# Patient Record
Sex: Female | Born: 1990
Health system: Southern US, Community
[De-identification: ages and names within clinical notes are randomized; demographics above are authoritative.]

## PROBLEM LIST (undated history)

## (undated) DIAGNOSIS — Z789 Other specified health status: Secondary | ICD-10-CM

## (undated) HISTORY — PX: OTHER SURGICAL HISTORY: SHX169

---
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
## 2015-07-02 ENCOUNTER — Inpatient Hospital Stay (HOSPITAL_COMMUNITY): Admit: 2015-07-02 | Payer: Self-pay

## 2016-05-09 DIAGNOSIS — Z4589 Encounter for adjustment and management of other implanted devices: Secondary | ICD-10-CM | POA: Diagnosis not present

## 2016-05-10 DIAGNOSIS — D2239 Melanocytic nevi of other parts of face: Secondary | ICD-10-CM | POA: Diagnosis not present

## 2016-05-10 DIAGNOSIS — D485 Neoplasm of uncertain behavior of skin: Secondary | ICD-10-CM | POA: Diagnosis not present

## 2016-05-10 DIAGNOSIS — D225 Melanocytic nevi of trunk: Secondary | ICD-10-CM | POA: Diagnosis not present

## 2016-05-24 DIAGNOSIS — Z0000 Encounter for general adult medical examination without abnormal findings: Secondary | ICD-10-CM | POA: Diagnosis not present

## 2016-05-30 DIAGNOSIS — D485 Neoplasm of uncertain behavior of skin: Secondary | ICD-10-CM | POA: Diagnosis not present

## 2016-06-13 DIAGNOSIS — D485 Neoplasm of uncertain behavior of skin: Secondary | ICD-10-CM | POA: Diagnosis not present

## 2016-07-07 DIAGNOSIS — M79642 Pain in left hand: Secondary | ICD-10-CM | POA: Diagnosis not present

## 2016-08-29 DIAGNOSIS — N925 Other specified irregular menstruation: Secondary | ICD-10-CM | POA: Diagnosis not present

## 2016-09-25 LAB — OB RESULTS CONSOLE ANTIBODY SCREEN: Antibody Screen: NEGATIVE

## 2016-09-25 LAB — OB RESULTS CONSOLE HIV ANTIBODY (ROUTINE TESTING): HIV: NONREACTIVE

## 2016-09-25 LAB — OB RESULTS CONSOLE ABO/RH: RH Type: NEGATIVE

## 2016-09-25 LAB — OB RESULTS CONSOLE RPR: RPR: NONREACTIVE

## 2016-09-25 LAB — OB RESULTS CONSOLE HEPATITIS B SURFACE ANTIGEN: HEP B S AG: NEGATIVE

## 2016-09-25 LAB — OB RESULTS CONSOLE RUBELLA ANTIBODY, IGM: RUBELLA: IMMUNE

## 2016-12-28 ENCOUNTER — Other Ambulatory Visit (HOSPITAL_COMMUNITY): Payer: Self-pay | Admitting: Obstetrics & Gynecology

## 2016-12-28 DIAGNOSIS — Z3689 Encounter for other specified antenatal screening: Secondary | ICD-10-CM

## 2017-01-03 ENCOUNTER — Encounter (HOSPITAL_COMMUNITY): Payer: Self-pay | Admitting: *Deleted

## 2017-01-05 ENCOUNTER — Ambulatory Visit (HOSPITAL_COMMUNITY)
Admission: RE | Admit: 2017-01-05 | Discharge: 2017-01-05 | Disposition: A | Discharge status: Home / Self Care | Payer: 59 | Source: Ambulatory Visit | Attending: Obstetrics & Gynecology | Admitting: Obstetrics & Gynecology

## 2017-01-05 ENCOUNTER — Other Ambulatory Visit (HOSPITAL_COMMUNITY): Payer: Self-pay | Admitting: Obstetrics & Gynecology

## 2017-01-05 ENCOUNTER — Encounter (HOSPITAL_COMMUNITY): Payer: Self-pay

## 2017-01-05 DIAGNOSIS — E669 Obesity, unspecified: Secondary | ICD-10-CM | POA: Insufficient documentation

## 2017-01-05 DIAGNOSIS — Z6836 Body mass index (BMI) 36.0-36.9, adult: Secondary | ICD-10-CM | POA: Diagnosis not present

## 2017-01-05 DIAGNOSIS — O26842 Uterine size-date discrepancy, second trimester: Secondary | ICD-10-CM | POA: Insufficient documentation

## 2017-01-05 DIAGNOSIS — Z3A26 26 weeks gestation of pregnancy: Secondary | ICD-10-CM | POA: Diagnosis not present

## 2017-01-05 DIAGNOSIS — O99212 Obesity complicating pregnancy, second trimester: Secondary | ICD-10-CM | POA: Diagnosis not present

## 2017-01-05 DIAGNOSIS — Z3689 Encounter for other specified antenatal screening: Secondary | ICD-10-CM | POA: Diagnosis present

## 2017-01-05 HISTORY — DX: Other specified health status: Z78.9

## 2017-01-26 DIAGNOSIS — Z23 Encounter for immunization: Secondary | ICD-10-CM | POA: Diagnosis not present

## 2017-02-27 NOTE — L&D Delivery Note (Signed)
Delivery Note Patient pushed well for 10 minutes.  At 9:57 AM a viable female was delivered via Vaginal, Spontaneous (Presentation: LOA ).  APGAR: 9, 10; weight pending .   Placenta status: Spontaneous , in tact .  Cord: 3V  with the following complications: None .  Cord pH: epidural  Anesthesia:  Epidural Episiotomy: None Lacerations: 2nd degree Suture Repair: 2.0 vicryl rapide Est. Blood Loss (mL): 300  Mom to postpartum.  Baby to Couplet care / Skin to Skin.  Alexa Lopez 04/09/2017, 10:19 AM

## 2017-03-08 LAB — OB RESULTS CONSOLE GBS: STREP GROUP B AG: NEGATIVE

## 2017-04-04 ENCOUNTER — Encounter (HOSPITAL_COMMUNITY): Payer: Self-pay | Admitting: *Deleted

## 2017-04-04 ENCOUNTER — Telehealth (HOSPITAL_COMMUNITY): Payer: Self-pay | Admitting: *Deleted

## 2017-04-04 NOTE — Telephone Encounter (Signed)
Preadmission screen  

## 2017-04-08 ENCOUNTER — Inpatient Hospital Stay (HOSPITAL_COMMUNITY)
Admission: AD | Admit: 2017-04-08 | Discharge: 2017-04-10 | DRG: 807 | Disposition: A | Discharge status: Home / Self Care | Payer: 59 | Source: Ambulatory Visit | Attending: Obstetrics | Admitting: Obstetrics

## 2017-04-08 DIAGNOSIS — O9902 Anemia complicating childbirth: Principal | ICD-10-CM | POA: Diagnosis present

## 2017-04-08 DIAGNOSIS — Z3A40 40 weeks gestation of pregnancy: Secondary | ICD-10-CM

## 2017-04-08 DIAGNOSIS — Z3689 Encounter for other specified antenatal screening: Secondary | ICD-10-CM

## 2017-04-08 DIAGNOSIS — O26893 Other specified pregnancy related conditions, third trimester: Secondary | ICD-10-CM | POA: Diagnosis not present

## 2017-04-08 DIAGNOSIS — D649 Anemia, unspecified: Secondary | ICD-10-CM | POA: Diagnosis present

## 2017-04-08 DIAGNOSIS — N898 Other specified noninflammatory disorders of vagina: Secondary | ICD-10-CM

## 2017-04-08 LAB — URINALYSIS, ROUTINE W REFLEX MICROSCOPIC
BILIRUBIN URINE: NEGATIVE
GLUCOSE, UA: NEGATIVE mg/dL
HGB URINE DIPSTICK: NEGATIVE
Ketones, ur: NEGATIVE mg/dL
NITRITE: NEGATIVE
PROTEIN: NEGATIVE mg/dL
Specific Gravity, Urine: 1.009 (ref 1.005–1.030)
pH: 8 (ref 5.0–8.0)

## 2017-04-08 LAB — POCT FERN TEST: POCT Fern Test: NEGATIVE

## 2017-04-08 LAB — AMNISURE RUPTURE OF MEMBRANE (ROM) NOT AT ARMC: Amnisure ROM: NEGATIVE

## 2017-04-08 NOTE — MAU Note (Signed)
Urine sent to lab 

## 2017-04-08 NOTE — Progress Notes (Signed)
Dr. Rogue Bussing updated on pt status. Pt may be discharge home or walk for one hour

## 2017-04-08 NOTE — Progress Notes (Addendum)
G2P1 @ [redacted] wksga. Here dt SROM clear at 2000 and ctx q3 mins. Denies bleeding. +fM. EMFM applied.  SVE 2/40/-3. Noted curdy vaginal discharge.   Fern test done. Negative  2200: speculum exam done. Pooling of fluid noted. Fern collected from it.   2229: relinquished care over to RN Brigette  2233: provider made aware of the suido-sinusoidal pattern.

## 2017-04-08 NOTE — MAU Provider Note (Signed)
History   [id number]   HPI Alexa Lopez is a 27 y.o. female  G2P1001 @40 .0 wks here with report of LOF around 8pm.  Leaking of fluid has continued. Pt reports q3 min contractions. She denies vaginal bleeding. Last intercourse was not recent. She reports + fetal movement. All other systems negative.    Patient's last menstrual period was 07/02/2016.  OB History  Gravida Para Term Preterm AB Living  2 1 1     1   SAB TAB Ectopic Multiple Live Births          1    # Outcome Date GA Lbr Len/2nd Weight Sex Delivery Anes PTL Lv  2 Current           1 Term 2014 [redacted]w[redacted]d  7 lb 1 oz (3.204 kg) F Vag-Spont Spinal  LIV      Past Medical History:  Diagnosis Date  . Medical history non-contributory     Family History  Problem Relation Age of Onset  . Lung cancer Father   . Heart disease Father   . Hypertension Father   . Atrial fibrillation Father   . Hypertension Maternal Grandmother   . Atrial fibrillation Maternal Grandmother   . Diabetes Maternal Grandmother   . Hypertension Paternal Grandmother   . Atrial fibrillation Paternal Grandmother     Social History   Socioeconomic History  . Marital status: Married    Spouse name: Not on file  . Number of children: Not on file  . Years of education: Not on file  . Highest education level: Not on file  Social Needs  . Financial resource strain: Not on file  . Food insecurity - worry: Not on file  . Food insecurity - inability: Not on file  . Transportation needs - medical: Not on file  . Transportation needs - non-medical: Not on file  Occupational History  . Not on file  Tobacco Use  . Smoking status: Never Smoker  . Smokeless tobacco: Never Used  Substance and Sexual Activity  . Alcohol use: No    Frequency: Never  . Drug use: No  . Sexual activity: Not on file  Other Topics Concern  . Not on file  Social History Narrative  . Not on file    No Known Allergies  No current facility-administered medications on  file prior to encounter.    Current Outpatient Medications on File Prior to Encounter  Medication Sig Dispense Refill  . butalbital-acetaminophen-caffeine (FIORICET WITH CODEINE) 50-325-40-30 MG capsule Take 1 capsule every 4 (four) hours as needed by mouth for headache.    . Prenatal Vit-Fe Fumarate-FA (PRENATAL VITAMIN PO) Take by mouth.       Review of Systems  Gastrointestinal: Positive for abdominal pain (ctx).  Genitourinary: Positive for vaginal discharge.     Physical Exam   Vitals:   04/08/17 2129  BP: 127/81  Pulse: 98  Resp: 18  Temp: 98.3 F (36.8 C)  TempSrc: Oral    Physical Exam  Constitutional: She is oriented to person, place, and time. She appears well-developed and well-nourished. No distress.  HENT:  Head: Normocephalic.  Neck: Normal range of motion.  Respiratory: Effort normal. No respiratory distress.  Genitourinary:  Genitourinary Comments: SSE: +pool cloudy fluid with copious white curdy discharge, fern neg  Musculoskeletal: Normal range of motion.  Neurological: She is alert and oriented to person, place, and time.  Psychiatric: She has a normal mood and affect.  EFM: 150 bpm, mod variability, + accels,  no decels Toco: 1-4  Results for orders placed or performed during the hospital encounter of 04/08/17 (from the past 24 hour(s))  Urinalysis, Routine w reflex microscopic     Status: Abnormal   Collection Time: 04/08/17  9:21 PM  Result Value Ref Range   Color, Urine YELLOW YELLOW   APPearance CLOUDY (A) CLEAR   Specific Gravity, Urine 1.009 1.005 - 1.030   pH 8.0 5.0 - 8.0   Glucose, UA NEGATIVE NEGATIVE mg/dL   Hgb urine dipstick NEGATIVE NEGATIVE   Bilirubin Urine NEGATIVE NEGATIVE   Ketones, ur NEGATIVE NEGATIVE mg/dL   Protein, ur NEGATIVE NEGATIVE mg/dL   Nitrite NEGATIVE NEGATIVE   Leukocytes, UA MODERATE (A) NEGATIVE   RBC / HPF 0-5 0 - 5 RBC/hpf   WBC, UA 6-30 0 - 5 WBC/hpf   Bacteria, UA RARE (A) NONE SEEN   Squamous  Epithelial / LPF 0-5 (A) NONE SEEN   WBC Clumps PRESENT    Mucus PRESENT   Fern Test     Status: None   Collection Time: 04/08/17 10:00 PM  Result Value Ref Range   POCT Fern Test Negative = intact amniotic membranes   Amnisure rupture of membrane (rom)not at [facility]     Status: None   Collection Time: 04/08/17 10:22 PM  Result Value Ref Range   Amnisure ROM NEGATIVE    MAU Course  Procedures  MDM Labs ordered and reviewed. No evidence of ROM. Management per Dr. Rogue Bussing.  Assessment and Plan  [redacted] weeks gestation Reactive NST Vaginal discharge in pregnancy Mngt per Dr. Estill Batten, [place], [place] 04/08/2017 10:05 PM

## 2017-04-09 ENCOUNTER — Inpatient Hospital Stay (HOSPITAL_COMMUNITY): Payer: 59 | Admitting: Anesthesiology

## 2017-04-09 ENCOUNTER — Encounter (HOSPITAL_COMMUNITY): Payer: Self-pay

## 2017-04-09 DIAGNOSIS — D649 Anemia, unspecified: Secondary | ICD-10-CM | POA: Diagnosis not present

## 2017-04-09 DIAGNOSIS — Z3483 Encounter for supervision of other normal pregnancy, third trimester: Secondary | ICD-10-CM | POA: Diagnosis not present

## 2017-04-09 DIAGNOSIS — O9902 Anemia complicating childbirth: Secondary | ICD-10-CM | POA: Diagnosis not present

## 2017-04-09 DIAGNOSIS — Z3A40 40 weeks gestation of pregnancy: Secondary | ICD-10-CM | POA: Diagnosis not present

## 2017-04-09 LAB — CBC
HCT: 34.5 % — ABNORMAL LOW (ref 36.0–46.0)
Hemoglobin: 11.3 g/dL — ABNORMAL LOW (ref 12.0–15.0)
MCH: 28.2 pg (ref 26.0–34.0)
MCHC: 32.8 g/dL (ref 30.0–36.0)
MCV: 86 fL (ref 78.0–100.0)
PLATELETS: 242 10*3/uL (ref 150–400)
RBC: 4.01 MIL/uL (ref 3.87–5.11)
RDW: 14 % (ref 11.5–15.5)
WBC: 11.2 10*3/uL — ABNORMAL HIGH (ref 4.0–10.5)

## 2017-04-09 LAB — RPR: RPR Ser Ql: NONREACTIVE

## 2017-04-09 MED ORDER — IBUPROFEN 600 MG PO TABS
600.0000 mg | ORAL_TABLET | Freq: Four times a day (QID) | ORAL | Status: DC
  Administered 2017-04-09 – 2017-04-10 (×5): 600 mg via ORAL
  Filled 2017-04-09 (×5): qty 1

## 2017-04-09 MED ORDER — PRENATAL MULTIVITAMIN CH
1.0000 | ORAL_TABLET | Freq: Every day | ORAL | Status: DC
  Administered 2017-04-09: 1 via ORAL
  Filled 2017-04-09: qty 1

## 2017-04-09 MED ORDER — OXYCODONE-ACETAMINOPHEN 5-325 MG PO TABS: 2.0000 | ORAL_TABLET | ORAL | Status: DC | PRN

## 2017-04-09 MED ORDER — BENZOCAINE-MENTHOL 20-0.5 % EX AERO
1.0000 "application " | INHALATION_SPRAY | CUTANEOUS | Status: DC | PRN
  Administered 2017-04-09: 1 via TOPICAL
  Filled 2017-04-09: qty 56

## 2017-04-09 MED ORDER — LACTATED RINGERS IV SOLN
500.0000 mL | Freq: Once | INTRAVENOUS | Status: AC
  Administered 2017-04-09: 500 mL via INTRAVENOUS

## 2017-04-09 MED ORDER — PHENYLEPHRINE 40 MCG/ML (10ML) SYRINGE FOR IV PUSH (FOR BLOOD PRESSURE SUPPORT)
80.0000 ug | PREFILLED_SYRINGE | INTRAVENOUS | Status: DC | PRN
  Filled 2017-04-09: qty 10

## 2017-04-09 MED ORDER — ACETAMINOPHEN 325 MG PO TABS: 650.0000 mg | ORAL_TABLET | ORAL | Status: DC | PRN

## 2017-04-09 MED ORDER — SIMETHICONE 80 MG PO CHEW: 80.0000 mg | CHEWABLE_TABLET | ORAL | Status: DC | PRN

## 2017-04-09 MED ORDER — COCONUT OIL OIL: 1.0000 "application " | TOPICAL_OIL | Status: DC | PRN

## 2017-04-09 MED ORDER — OXYCODONE HCL 5 MG PO TABS: 10.0000 mg | ORAL_TABLET | ORAL | Status: DC | PRN

## 2017-04-09 MED ORDER — LACTATED RINGERS IV SOLN
INTRAVENOUS | Status: DC
  Administered 2017-04-09 (×2): via INTRAVENOUS

## 2017-04-09 MED ORDER — SENNOSIDES-DOCUSATE SODIUM 8.6-50 MG PO TABS
2.0000 | ORAL_TABLET | ORAL | Status: DC
  Administered 2017-04-09: 2 via ORAL
  Filled 2017-04-09: qty 2

## 2017-04-09 MED ORDER — OXYTOCIN 40 UNITS IN LACTATED RINGERS INFUSION - SIMPLE MED
2.5000 [IU]/h | INTRAVENOUS | Status: DC
  Filled 2017-04-09: qty 1000

## 2017-04-09 MED ORDER — ONDANSETRON HCL 4 MG/2ML IJ SOLN: 4.0000 mg | Freq: Four times a day (QID) | INTRAMUSCULAR | Status: DC | PRN

## 2017-04-09 MED ORDER — OXYCODONE-ACETAMINOPHEN 5-325 MG PO TABS: 1.0000 | ORAL_TABLET | ORAL | Status: DC | PRN

## 2017-04-09 MED ORDER — ONDANSETRON HCL 4 MG/2ML IJ SOLN: 4.0000 mg | INTRAMUSCULAR | Status: DC | PRN

## 2017-04-09 MED ORDER — TETANUS-DIPHTH-ACELL PERTUSSIS 5-2.5-18.5 LF-MCG/0.5 IM SUSP: 0.5000 mL | Freq: Once | INTRAMUSCULAR | Status: DC

## 2017-04-09 MED ORDER — DIBUCAINE 1 % RE OINT: 1.0000 "application " | TOPICAL_OINTMENT | RECTAL | Status: DC | PRN

## 2017-04-09 MED ORDER — ONDANSETRON HCL 4 MG PO TABS: 4.0000 mg | ORAL_TABLET | ORAL | Status: DC | PRN

## 2017-04-09 MED ORDER — FENTANYL 2.5 MCG/ML BUPIVACAINE 1/10 % EPIDURAL INFUSION (WH - ANES)
14.0000 mL/h | INTRAMUSCULAR | Status: DC | PRN
  Administered 2017-04-09 (×2): 14 mL/h via EPIDURAL
  Filled 2017-04-09 (×2): qty 100

## 2017-04-09 MED ORDER — PHENYLEPHRINE 40 MCG/ML (10ML) SYRINGE FOR IV PUSH (FOR BLOOD PRESSURE SUPPORT): 80.0000 ug | PREFILLED_SYRINGE | INTRAVENOUS | Status: DC | PRN

## 2017-04-09 MED ORDER — EPHEDRINE 5 MG/ML INJ: 10.0000 mg | INTRAVENOUS | Status: DC | PRN

## 2017-04-09 MED ORDER — WITCH HAZEL-GLYCERIN EX PADS: 1.0000 "application " | MEDICATED_PAD | CUTANEOUS | Status: DC | PRN

## 2017-04-09 MED ORDER — LIDOCAINE HCL (PF) 1 % IJ SOLN
INTRAMUSCULAR | Status: DC | PRN
  Administered 2017-04-09 (×2): 5 mL via EPIDURAL

## 2017-04-09 MED ORDER — DIPHENHYDRAMINE HCL 25 MG PO CAPS: 25.0000 mg | ORAL_CAPSULE | Freq: Four times a day (QID) | ORAL | Status: DC | PRN

## 2017-04-09 MED ORDER — LACTATED RINGERS IV SOLN: 500.0000 mL | INTRAVENOUS | Status: DC | PRN

## 2017-04-09 MED ORDER — ACETAMINOPHEN 325 MG PO TABS
650.0000 mg | ORAL_TABLET | ORAL | Status: DC | PRN
Start: 2017-04-09 — End: 2017-04-09

## 2017-04-09 MED ORDER — LIDOCAINE HCL (PF) 1 % IJ SOLN: 30.0000 mL | INTRAMUSCULAR | Status: DC | PRN

## 2017-04-09 MED ORDER — OXYCODONE HCL 5 MG PO TABS: 5.0000 mg | ORAL_TABLET | ORAL | Status: DC | PRN

## 2017-04-09 MED ORDER — SOD CITRATE-CITRIC ACID 500-334 MG/5ML PO SOLN: 30.0000 mL | ORAL | Status: DC | PRN

## 2017-04-09 MED ORDER — FLEET ENEMA 7-19 GM/118ML RE ENEM: 1.0000 | ENEMA | RECTAL | Status: DC | PRN

## 2017-04-09 MED ORDER — DIPHENHYDRAMINE HCL 50 MG/ML IJ SOLN: 12.5000 mg | INTRAMUSCULAR | Status: DC | PRN

## 2017-04-09 MED ORDER — BUTORPHANOL TARTRATE 1 MG/ML IJ SOLN
1.0000 mg | INTRAMUSCULAR | Status: DC | PRN
  Administered 2017-04-09: 1 mg via INTRAVENOUS
  Filled 2017-04-09: qty 1

## 2017-04-09 MED ORDER — OXYTOCIN BOLUS FROM INFUSION
500.0000 mL | Freq: Once | INTRAVENOUS | Status: AC
  Administered 2017-04-09: 500 mL via INTRAVENOUS

## 2017-04-09 NOTE — Progress Notes (Signed)
Comfortable w epidural.  Starting to feel some pressure  BP 127/77   Pulse 95   Temp 98.6 F (37 C) (Oral)   Resp 16   Ht 5\' 3"  (1.6 m)   Wt 95.3 kg (210 lb)   LMP 07/02/2016   SpO2 99%   BMI 37.20 kg/m   Abd: gravid, soft, EFW 7# Toco: q2 min EFM: 160, mod var, + accels, no decels SVE: 8/90/-1  A&P: g2p1 @ [redacted]w[redacted]d w labor Cont expectant management. If no cervical change in 2 hours will start pitocin FSR/vtx/gbs neg

## 2017-04-09 NOTE — Anesthesia Pain Management Evaluation Note (Signed)
  CRNA Pain Management Visit Note  Patient: Alexa Lopez, 27 y.o., female  "Hello I am a member of the anesthesia team at [facility] We have an anesthesia team available at all times to provide care throughout the hospital, including epidural management and anesthesia for C-section. I don't know your plan for the delivery whether it a natural birth, water birth, IV sedation, nitrous supplementation, doula or epidural, but we want to meet your pain goals."   1.Was your pain managed to your expectations on prior hospitalizations?   Yes   2.What is your expectation for pain management during this hospitalization?     Epidural  3.How can we help you reach that goal? epidural  Record the patient's initial score and the patient's pain goal.   Pain: 0  Pain Goal: 4 [facility] wants you to be able to say your pain was always managed very well.  Jabier Mutton 04/09/2017

## 2017-04-09 NOTE — Anesthesia Procedure Notes (Signed)
Epidural Patient location during procedure: OB Start time: 04/09/2017 1:45 AM  Staffing Anesthesiologist: Murvin Natal, MD Performed: anesthesiologist   Preanesthetic Checklist Completed: patient identified, site marked, pre-op evaluation, timeout performed, IV checked, risks and benefits discussed and monitors and equipment checked  Epidural Patient position: sitting Prep: DuraPrep Patient monitoring: heart rate, cardiac monitor, continuous pulse ox and blood pressure Approach: midline Location: L4-L5 Injection technique: LOR air  Needle:  Needle type: Tuohy  Needle gauge: 17 G Needle length: 9 cm Needle insertion depth: 7 cm Catheter type: closed end flexible Catheter size: 19 Gauge Catheter at skin depth: 12 cm Test dose: negative and Other  Assessment Events: blood not aspirated, injection not painful, no injection resistance and negative IV test  Additional Notes Informed consent obtained prior to proceeding including risk of failure, 1% risk of PDPH, risk of minor discomfort and bruising. Discussed alternatives to epidural analgesia and patient desires to proceed.  Timeout performed pre-procedure verifying patient name, procedure, and platelet count.  Patient tolerated procedure well. Reason for block:procedure for pain

## 2017-04-09 NOTE — Lactation Note (Signed)
This note was copied from a baby's chart. Lactation Consultation Note  Patient Name: Alexa Lopez EHOZY'Y Date: 04/09/2017 Reason for consult: Initial assessment;Term  Visited with P2 Mom with 23 hr old term baby.  Mom has latched baby 4 times and denies any difficulty.  With her 1st baby (5 yrs ago), baby stopped breastfeeding for "no reason".  She took baby to the Pediatrician, and started supplementing baby with formula.  She would like to breastfeed this baby longer if possible.  Asked about her milk supply, and she reports knowing when her milk volume came in, baby gained weight well, no nipple trauma etc.    This baby was STS and cueing, and offered to assist with latching.  Mom has short shafted large diameter nipples.  Explained how to sandwich up breast to facilitate a deeper latch to breast.  Baby latched with out much trouble.  Regular swallowing identified for Mom.  Taught her how to use alternate breast compression to increase milk transfer.   Encouraged continued STS, and feeding baby often on cue, asking for help with latch as needed. Hand expression demonstrated and colostrum easy to express.  Encouraged Mom to hand express/use breast massage to stimulate milk supply.  Talked about spoon feeding if baby isn't latching.  Lactation brochure given to Mom.  Explained about IP and OP lactation support. Mom to call prn.   Maternal Data Formula Feeding for Exclusion: No Has patient been taught Hand Expression?: Yes Does the patient have breastfeeding experience prior to this delivery?: Yes  Feeding Feeding Type: Breast Fed  LATCH Score Latch: Grasps breast easily, tongue down, lips flanged, rhythmical sucking.  Audible Swallowing: Spontaneous and intermittent  Type of Nipple: Everted at rest and after stimulation(Large diameter)  Comfort (Breast/Nipple): Soft / non-tender  Hold (Positioning): Assistance needed to correctly position infant at breast and maintain  latch.  LATCH Score: 9  Interventions Interventions: Breast feeding basics reviewed;Assisted with latch;Skin to skin;Breast massage;Hand express;Breast compression;Adjust position;Support pillows;Position options;Expressed milk  Lactation Tools Discussed/Used WIC Program: No   Consult Status Consult Status: Follow-up Date: 04/10/17 Follow-up type: In-patient    [name] 04/09/2017, 6:14 PM

## 2017-04-09 NOTE — H&P (Addendum)
27 y.o. [redacted]w[redacted]d  G2P1001 comes in c/o contractions.  Otherwise has good fetal movement and no bleeding.  Past Medical History:  Diagnosis Date  . Medical history non-contributory     Past Surgical History:  Procedure Laterality Date  . thumb surgery      OB History  Gravida Para Term Preterm AB Living  2 1 1     1   SAB TAB Ectopic Multiple Live Births          1    # Outcome Date GA Lbr Len/2nd Weight Sex Delivery Anes PTL Lv  2 Current           1 Term 2014 [redacted]w[redacted]d  3.204 kg (7 lb 1 oz) F Vag-Spont Spinal  LIV      Social History   Socioeconomic History  . Marital status: Married    Spouse name: Not on file  . Number of children: Not on file  . Years of education: Not on file  . Highest education level: Not on file  Social Needs  . Financial resource strain: Not on file  . Food insecurity - worry: Not on file  . Food insecurity - inability: Not on file  . Transportation needs - medical: Not on file  . Transportation needs - non-medical: Not on file  Occupational History  . Not on file  Tobacco Use  . Smoking status: Never Smoker  . Smokeless tobacco: Never Used  Substance and Sexual Activity  . Alcohol use: No    Frequency: Never  . Drug use: No  . Sexual activity: Not on file  Other Topics Concern  . Not on file  Social History Narrative  . Not on file   Patient has no known allergies.    Prenatal Transfer Tool  Maternal Diabetes: No Genetic Screening: Normal Maternal Ultrasounds/Referrals: Abnormal:  Findings:   Other: lagging measurement of fetal head Fetal Ultrasounds or other Referrals:  Referred to Materal Fetal Medicine - subsequent US showing appropriate symmetric fetal growth Maternal Substance Abuse:  No Significant Maternal Medications:  None Significant Maternal Lab Results: Lab values include: Group B Strep negative  Other PNC: uncomplicated.    Vitals:   04/09/17 0221 04/09/17 0226 04/09/17 0230 04/09/17 0300  BP: 115/68 98/73 108/64  106/65  Pulse: 94 89 100 89  Resp:      Temp:      TempSrc:      SpO2:      Weight:      Height:       PE per MAU CNM Lungs/Cor:  NAD Abdomen:  soft, gravid Ex:  no cords, erythema SVE:  4/80/-2 at admission FHTs:  130, good STV, NST R Toco:  q2-3   A/P   Admit to L&D with labor at term  GBS Neg  Epidural already placed  Continue to monitor  [place], [facility]

## 2017-04-09 NOTE — Anesthesia Postprocedure Evaluation (Signed)
Anesthesia Post Note  Patient: Alexa Lopez  Procedure(s) Performed: AN AD HOC LABOR EPIDURAL     Patient location during evaluation: Mother Baby Anesthesia Type: Epidural Level of consciousness: awake and alert Pain management: pain level controlled Vital Signs Assessment: post-procedure vital signs reviewed and stable Respiratory status: spontaneous breathing, nonlabored ventilation and respiratory function stable Cardiovascular status: stable Postop Assessment: no headache, no backache, epidural receding, adequate PO intake, no apparent nausea or vomiting and patient able to bend at knees Anesthetic complications: no    Last Vitals:  Vitals:   04/09/17 1141 04/09/17 1230  BP: 127/72 116/65  Pulse: 95 95  Resp: 16 18  Temp: 37.1 C 36.9 C  SpO2:  97%    Last Pain:  Vitals:   04/09/17 1230  TempSrc: Oral  PainSc: 1    Pain Goal:                 Terin Cragle Hristova

## 2017-04-09 NOTE — Anesthesia Preprocedure Evaluation (Signed)
Anesthesia Evaluation  Patient identified by MRN, date of birth, ID band Patient awake    Reviewed: Allergy & Precautions, H&P , NPO status , Patient's Chart, lab work & pertinent test results  History of Anesthesia Complications Negative for: history of anesthetic complications  Airway Mallampati: II  TM Distance: >3 FB Neck ROM: full    Dental no notable dental hx. (+) Teeth Intact   Pulmonary neg pulmonary ROS,    Pulmonary exam normal breath sounds clear to auscultation       Cardiovascular negative cardio ROS Normal cardiovascular exam Rhythm:regular Rate:Normal     Neuro/Psych negative neurological ROS  negative psych ROS   GI/Hepatic negative GI ROS, Neg liver ROS,   Endo/Other  negative endocrine ROS  Renal/GU negative Renal ROS  negative genitourinary   Musculoskeletal   Abdominal (+) + obese,   Peds  Hematology  (+) anemia ,   Anesthesia Other Findings   Reproductive/Obstetrics (+) Pregnancy                             Anesthesia Physical Anesthesia Plan  ASA: II  Anesthesia Plan: Epidural   Post-op Pain Management:    Induction:   PONV Risk Score and Plan:   Airway Management Planned:   Additional Equipment:   Intra-op Plan:   Post-operative Plan:   Informed Consent: I have reviewed the patients History and Physical, chart, labs and discussed the procedure including the risks, benefits and alternatives for the proposed anesthesia with the patient or authorized representative who has indicated his/her understanding and acceptance.     Plan Discussed with:   Anesthesia Plan Comments:         Anesthesia Quick Evaluation

## 2017-04-10 ENCOUNTER — Inpatient Hospital Stay (HOSPITAL_COMMUNITY): Admission: RE | Admit: 2017-04-10 | Payer: 59 | Source: Ambulatory Visit

## 2017-04-10 ENCOUNTER — Other Ambulatory Visit: Payer: Self-pay

## 2017-04-10 LAB — CBC
HCT: 29 % — ABNORMAL LOW (ref 36.0–46.0)
Hemoglobin: 9.7 g/dL — ABNORMAL LOW (ref 12.0–15.0)
MCH: 28.6 pg (ref 26.0–34.0)
MCHC: 33.4 g/dL (ref 30.0–36.0)
MCV: 85.5 fL (ref 78.0–100.0)
PLATELETS: 181 10*3/uL (ref 150–400)
RBC: 3.39 MIL/uL — AB (ref 3.87–5.11)
RDW: 14.2 % (ref 11.5–15.5)
WBC: 10.2 10*3/uL (ref 4.0–10.5)

## 2017-04-10 LAB — BIRTH TISSUE RECOVERY COLLECTION (PLACENTA DONATION)

## 2017-04-10 MED ORDER — OXYCODONE-ACETAMINOPHEN 5-325 MG PO TABS: 1.0000 | ORAL_TABLET | Freq: Four times a day (QID) | ORAL | 0 refills | Status: AC | PRN

## 2017-04-10 MED ORDER — IBUPROFEN 600 MG PO TABS: 600.0000 mg | ORAL_TABLET | Freq: Four times a day (QID) | ORAL | 0 refills | Status: AC | PRN

## 2017-04-10 MED ORDER — RHO D IMMUNE GLOBULIN 1500 UNIT/2ML IJ SOSY
300.0000 ug | PREFILLED_SYRINGE | Freq: Once | INTRAMUSCULAR | Status: AC
  Administered 2017-04-10: 300 ug via INTRAVENOUS
  Filled 2017-04-10: qty 2

## 2017-04-10 NOTE — Discharge Summary (Signed)
Obstetric Discharge Summary Reason for Admission: onset of labor Prenatal Procedures: ultrasound Intrapartum Procedures: spontaneous vaginal delivery Postpartum Procedures: none Complications-Operative and Postpartum: 2nd degree perineal laceration Hemoglobin  Date Value Ref Range Status  04/10/2017 9.7 (L) 12.0 - 15.0 g/dL Final   HCT  Date Value Ref Range Status  04/10/2017 29.0 (L) 36.0 - 46.0 % Final    Physical Exam:  General: alert, cooperative and appears stated age 6: appropriate Uterine Fundus: firm DVT Evaluation: No evidence of DVT seen on physical exam.  Discharge Diagnoses: Term Pregnancy-delivered  Discharge Information: Date: 04/10/2017 Activity: pelvic rest Diet: routine Medications: Ibuprofen and Percocet Condition: improved Instructions: refer to practice specific booklet Discharge to: home Follow-up Information    Jerelyn Charles, MD Follow up in 4 week(s).   Specialty:  Obstetrics Why:  For a post-partum eval Contact information: [place] [address] [phone]           Newborn Data: Live born female  Birth Weight: 7 lb 2.3 oz (3240 g) APGAR: 77, 10  Newborn Delivery   Birth date/time:  04/09/2017 09:57:00 Delivery type:  Vaginal, Spontaneous     Home with mother.  Vanessa Kick 04/10/2017, 9:23 AM

## 2017-04-10 NOTE — Lactation Note (Signed)
This note was copied from a baby's chart. Lactation Consultation Note  Patient Name: Alexa Lopez OOILN'Z Date: 04/10/2017 Reason for consult: Term;Follow-up assessment  12 hours old female who is being exclusively BF by her mother, she's a P2 and feels very confident and experienced with BF. Discussed discharged instructions with mom and what signs to watch for to make sure baby is getting enough breastmilk. Also, reviewed engorgement prevention and treatment, mom is aware of [place] services and will contact if needed.    Interventions Interventions: Breast feeding basics reviewed  Lactation Tools Discussed/Used     Consult Status Consult Status: Complete    Cyndee Giammarco S Halli Equihua 04/10/2017, 11:12 AM

## 2017-04-10 NOTE — Progress Notes (Signed)
Post Partum Day 1 Subjective: no complaints, up ad lib, voiding and tolerating PO  Objective: Blood pressure 115/63, pulse 71, temperature (!) 97.5 F (36.4 C), temperature source Oral, resp. rate 17, height 5\' 3"  (1.6 m), weight 95.3 kg (210 lb), last menstrual period 07/02/2016, SpO2 99 %, unknown if currently breastfeeding.  Physical Exam:  General: alert, cooperative and appears stated age Lochia: appropriate Uterine Fundus: firm   Recent Labs    04/09/17 0040 04/10/17 0535  HGB 11.3* 9.7*  HCT 34.5* 29.0*    Assessment/Plan: Discharge home  Breastfeeding Pt desires early D?C home. Will D/C home, if baby not discharged will cancel mother's discharge   LOS: 1 day   Vanessa Kick 04/10/2017, 9:25 AM

## 2017-04-11 LAB — RH IG WORKUP (INCLUDES ABO/RH)
ABO/RH(D): A NEG
FETAL SCREEN: NEGATIVE
Gestational Age(Wks): 40.1
Unit division: 0

## 2017-04-13 LAB — TYPE AND SCREEN
ABO/RH(D): A NEG
Antibody Screen: POSITIVE
UNIT DIVISION: 0
Unit division: 0

## 2017-04-13 LAB — BPAM RBC
Blood Product Expiration Date: 201903022359
Blood Product Expiration Date: 201903022359
UNIT TYPE AND RH: 600
Unit Type and Rh: 600

## 2017-06-01 DIAGNOSIS — Z3202 Encounter for pregnancy test, result negative: Secondary | ICD-10-CM | POA: Diagnosis not present

## 2017-11-29 DIAGNOSIS — R5383 Other fatigue: Secondary | ICD-10-CM | POA: Diagnosis not present

## 2017-11-29 DIAGNOSIS — Z6834 Body mass index (BMI) 34.0-34.9, adult: Secondary | ICD-10-CM | POA: Diagnosis not present

## 2017-11-29 DIAGNOSIS — R109 Unspecified abdominal pain: Secondary | ICD-10-CM | POA: Diagnosis not present

## 2017-12-06 DIAGNOSIS — D510 Vitamin B12 deficiency anemia due to intrinsic factor deficiency: Secondary | ICD-10-CM | POA: Diagnosis not present

## 2017-12-14 DIAGNOSIS — D510 Vitamin B12 deficiency anemia due to intrinsic factor deficiency: Secondary | ICD-10-CM | POA: Diagnosis not present

## 2017-12-21 DIAGNOSIS — D510 Vitamin B12 deficiency anemia due to intrinsic factor deficiency: Secondary | ICD-10-CM | POA: Diagnosis not present

## 2018-01-01 DIAGNOSIS — D510 Vitamin B12 deficiency anemia due to intrinsic factor deficiency: Secondary | ICD-10-CM | POA: Diagnosis not present

## 2018-02-05 DIAGNOSIS — D510 Vitamin B12 deficiency anemia due to intrinsic factor deficiency: Secondary | ICD-10-CM | POA: Diagnosis not present

## 2018-03-06 DIAGNOSIS — D225 Melanocytic nevi of trunk: Secondary | ICD-10-CM | POA: Diagnosis not present

## 2018-03-06 DIAGNOSIS — D485 Neoplasm of uncertain behavior of skin: Secondary | ICD-10-CM | POA: Diagnosis not present

## 2018-03-06 DIAGNOSIS — D224 Melanocytic nevi of scalp and neck: Secondary | ICD-10-CM | POA: Diagnosis not present

## 2018-03-21 DIAGNOSIS — D485 Neoplasm of uncertain behavior of skin: Secondary | ICD-10-CM | POA: Diagnosis not present

## 2018-03-22 DIAGNOSIS — D510 Vitamin B12 deficiency anemia due to intrinsic factor deficiency: Secondary | ICD-10-CM | POA: Diagnosis not present

## 2018-04-05 DIAGNOSIS — E669 Obesity, unspecified: Secondary | ICD-10-CM | POA: Diagnosis not present

## 2018-04-05 DIAGNOSIS — Z0000 Encounter for general adult medical examination without abnormal findings: Secondary | ICD-10-CM | POA: Diagnosis not present

## 2018-04-05 DIAGNOSIS — Z6832 Body mass index (BMI) 32.0-32.9, adult: Secondary | ICD-10-CM | POA: Diagnosis not present

## 2018-04-29 DIAGNOSIS — E538 Deficiency of other specified B group vitamins: Secondary | ICD-10-CM | POA: Diagnosis not present

## 2018-06-04 DIAGNOSIS — D519 Vitamin B12 deficiency anemia, unspecified: Secondary | ICD-10-CM | POA: Diagnosis not present

## 2018-07-01 DIAGNOSIS — E538 Deficiency of other specified B group vitamins: Secondary | ICD-10-CM | POA: Diagnosis not present

## 2018-11-18 IMAGING — US US MFM OB DETAIL+14 WK
1 series · 13 of 28 positions shown · non-contrast
Comparison: none

[Series 1: us mfm ob detail+14 wk · 13 of 104 slices shown]
[im 4/104]
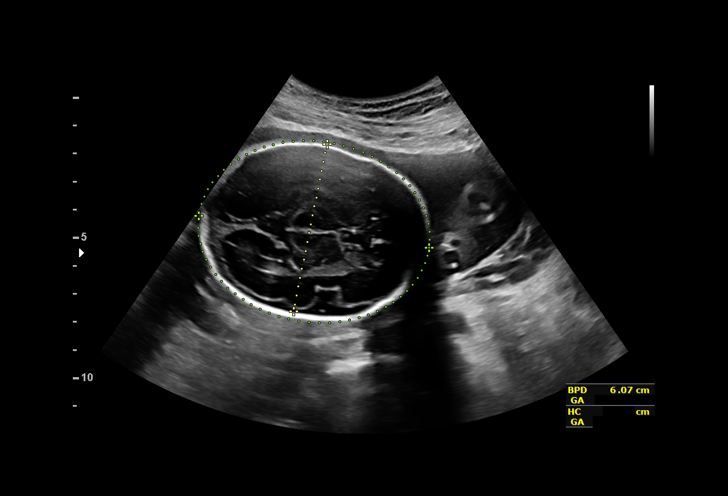
[im 12/104]
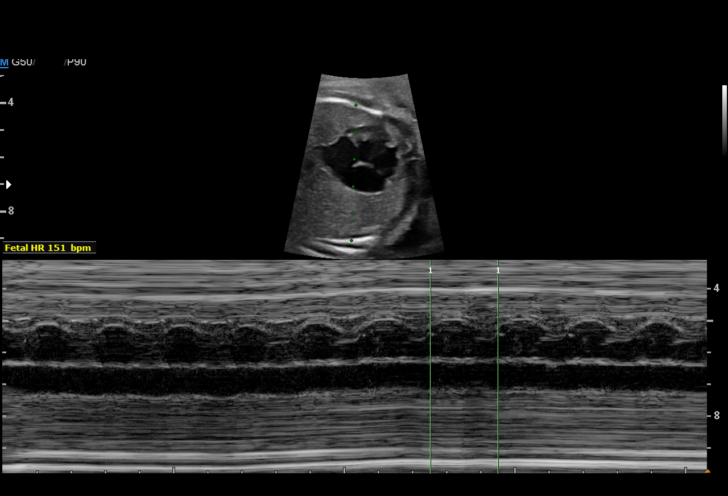
[im 20/104]
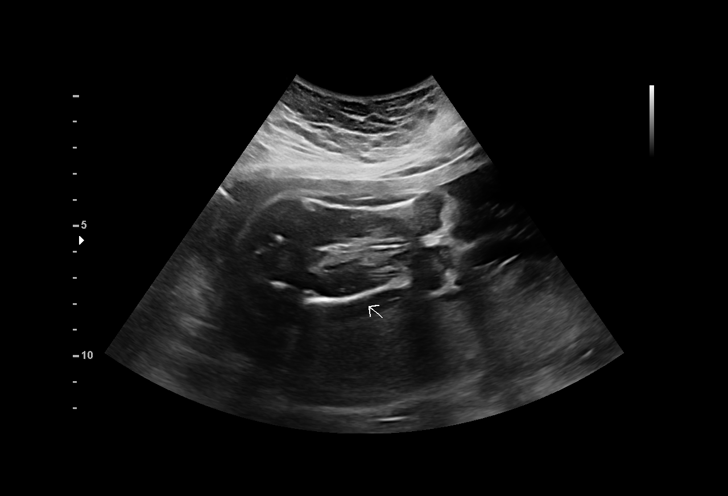
[im 27/104]
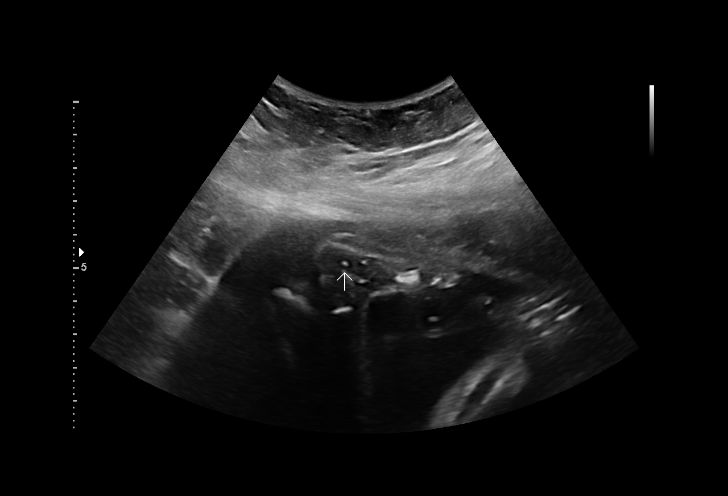
[im 35/104]
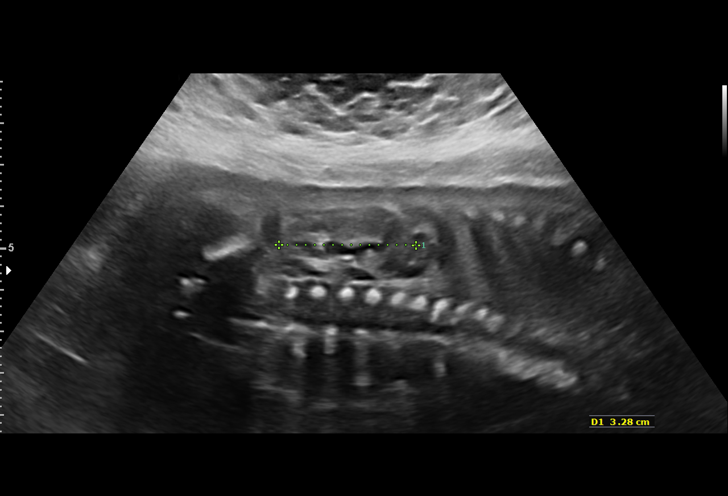
[im 42/104]
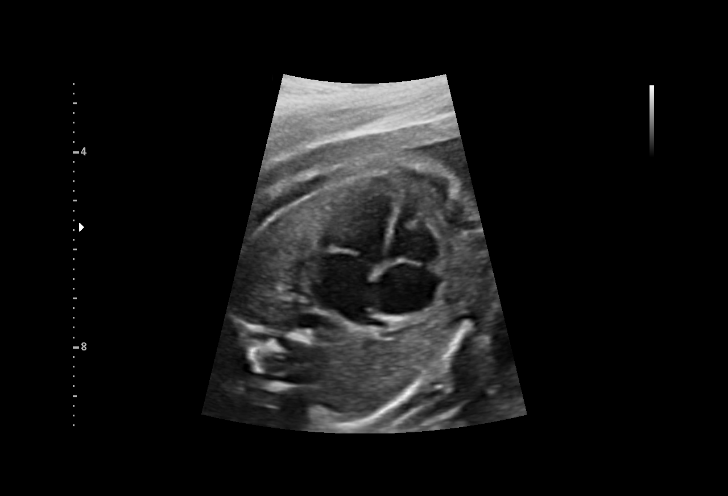
[im 54/104]
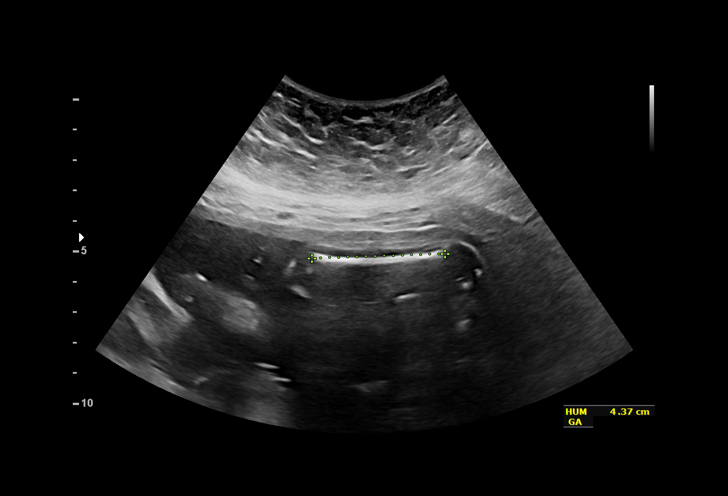
[im 62/104]
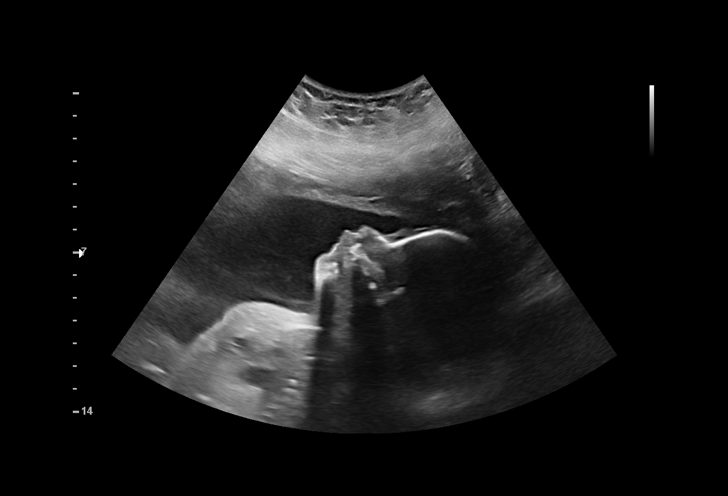
[im 69/104]
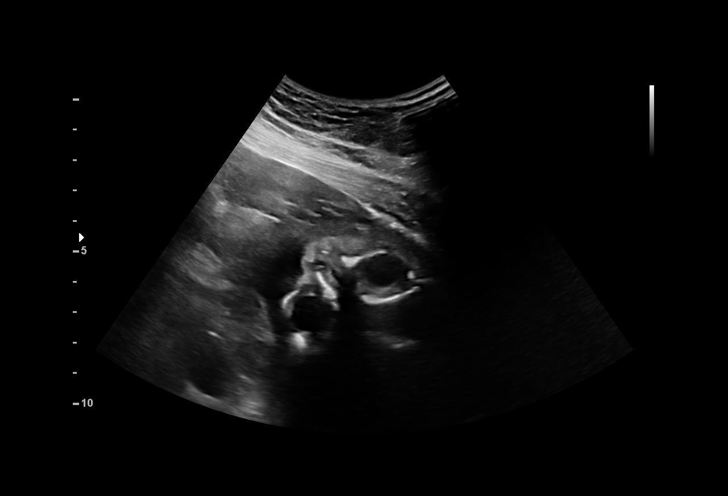
[im 77/104]
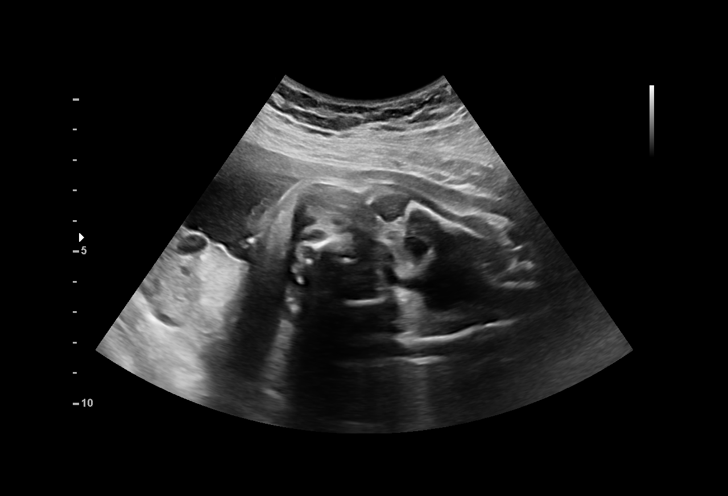
[im 84/104]
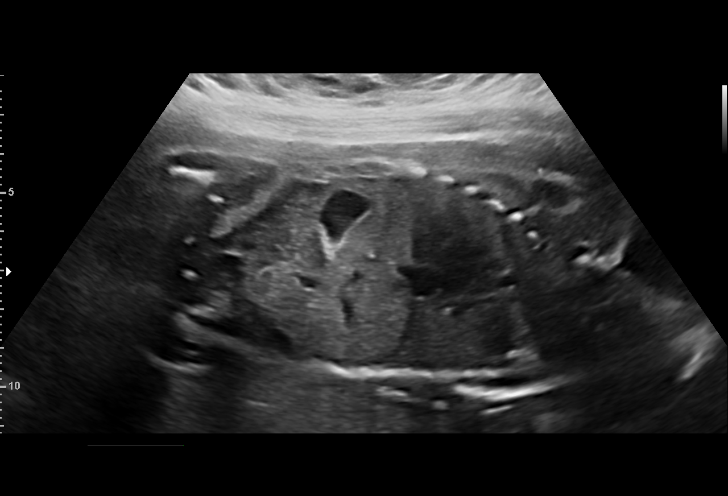
[im 92/104]
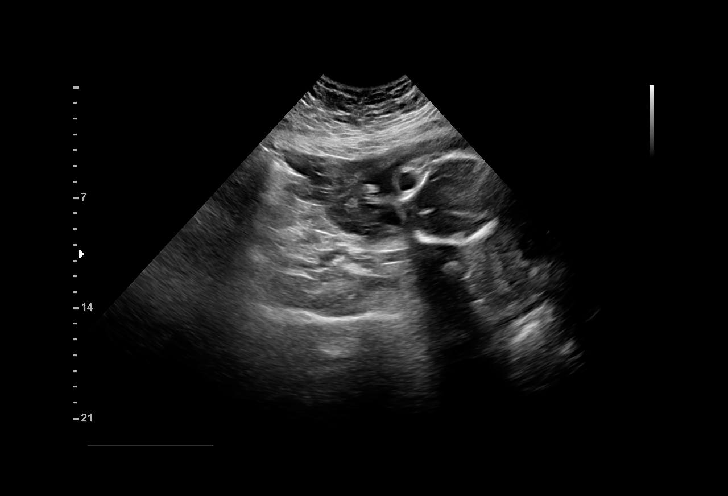
[im 100/104]
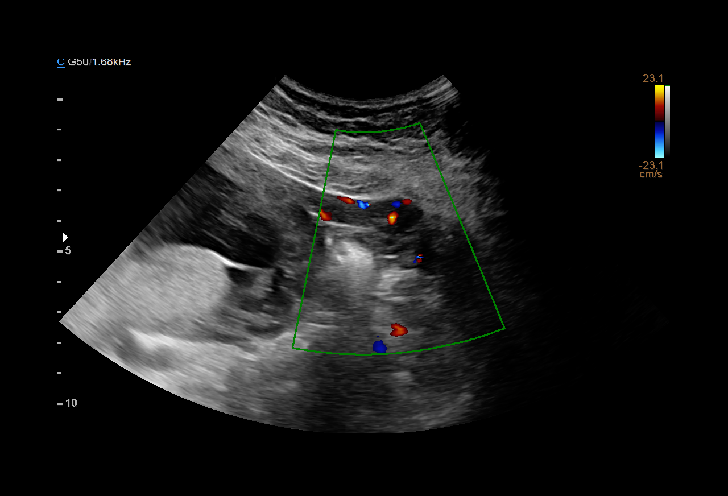

[13 of 28 positions shown; findings below may reference images not displayed]

1  JERRY JOVEL               [id number]      [id number]     [id number]
Indications

26 weeks gestation of pregnancy
Obesity complicating pregnancy, second
trimester (prepreg BMI 33.1)
Size-Date Discrepancy
OB History

Blood Type:            Height:  5'3"   Weight (lb):  205       BMI:
Gravidity:    2         Term:   1
Living:       1
Fetal Evaluation

Num Of Fetuses:     1
Fetal Heart         151
Rate(bpm):
Cardiac Activity:   Observed
Presentation:       Cephalic
Placenta:           Posterior, above cervical os
P. Cord Insertion:  Not well visualized

Amniotic Fluid
AFI FV:      Subjectively within normal limits

Largest Pocket(cm)
3.53
Biometry

BPD:      61.2  mm     G. Age:  24w 6d          2  %    CI:        69.02   %    70 - 86
FL/HC:      18.8   %    18.6 -
HC:      235.3  mm     G. Age:  25w 4d          4  %    HC/AC:      1.11        1.05 -
AC:      212.5  mm     G. Age:  25w 6d         17  %    FL/BPD:     72.2   %    71 - 87
FL:       44.2  mm     G. Age:  24w 4d        < 3  %    FL/AC:      20.8   %    20 - 24
HUM:        43  mm     G. Age:  25w 5d         23  %
CER:      30.1  mm     G. Age:  26w 4d         46  %
Est. FW:     789  gm    1 lb 12 oz      23  %
Gestational Age

LMP:           26w 5d        Date:  07/02/16                 EDD:   04/08/17
U/S Today:     25w 2d                                        EDD:   04/18/17
Best:          26w 5d     Det. By:  LMP  (07/02/16)          EDD:   04/08/17
Anatomy

Cranium:               Appears normal         Aortic Arch:            Appears normal
Cavum:                 Appears normal         Ductal Arch:            Not well visualized
Ventricles:            Appears normal         Diaphragm:              Appears normal
Choroid Plexus:        Appears normal         Stomach:                Appears normal, left
sided
Cerebellum:            Appears normal         Abdomen:                Appears normal
Posterior Fossa:       Appears normal         Abdominal Wall:         Not well visualized
Nuchal Fold:           Not applicable (>20    Cord Vessels:           Appears normal (3
wks GA)                                        vessel cord)
Face:                  Appears normal         Kidneys:                Appear normal
(orbits and profile)
Lips:                  Appears normal         Bladder:                Appears normal
Thoracic:              Appears normal         Spine:                  Not well visualized
Heart:                 Appears normal         Upper Extremities:      Appears normal;
(4CH, axis, and situs                          hands nwv
RVOT:                  Not well visualized    Lower Extremities:      Appears normal
LVOT:                  Appears normal

Other:  Fetus appears to be a female. Heels visualized. Technically difficult
due to maternal habitus and fetal position.
Cervix Uterus Adnexa

Cervix
Length:           4.56  cm.
Normal appearance by transabdominal scan.

Uterus
No abnormality visualized.

Left Ovary
Size(cm)       3.5  x   1.3    x  2.8       Vol(ml):
Within normal limits.

Right Ovary
Size(cm)       4.1  x   1.5    x  2.5       Vol(ml):
Within normal limits.
Impression

Singleton intrauterine pregnancy at 26 weeks 5 days
gestation with fetal cardiac activity
Cephalic presentation
Posterior placenta
Normal appearing growth
HC at 
4th centile which does not represent microcephaly
Limited fetal anatomic survey secondary to maternal habitus
and fetal position
Normal appearing cervical length
Recommendations

Recommend follow-up ultrasound examination in 
 4 weeks
We would be glad to do here or this could be done in her
providers office
As long as the bioparameters continue at the current centiles,
resume normal obstetric care as clinically indicated

## 2019-03-18 DIAGNOSIS — Z6836 Body mass index (BMI) 36.0-36.9, adult: Secondary | ICD-10-CM | POA: Diagnosis not present

## 2019-03-18 DIAGNOSIS — Z124 Encounter for screening for malignant neoplasm of cervix: Secondary | ICD-10-CM | POA: Diagnosis not present

## 2019-03-18 DIAGNOSIS — Z01419 Encounter for gynecological examination (general) (routine) without abnormal findings: Secondary | ICD-10-CM | POA: Diagnosis not present

## 2019-05-19 DIAGNOSIS — E669 Obesity, unspecified: Secondary | ICD-10-CM | POA: Diagnosis not present

## 2019-05-19 DIAGNOSIS — Z0000 Encounter for general adult medical examination without abnormal findings: Secondary | ICD-10-CM | POA: Diagnosis not present

## 2019-05-19 DIAGNOSIS — E538 Deficiency of other specified B group vitamins: Secondary | ICD-10-CM | POA: Diagnosis not present

## 2019-05-19 DIAGNOSIS — Z6836 Body mass index (BMI) 36.0-36.9, adult: Secondary | ICD-10-CM | POA: Diagnosis not present

## 2019-06-30 DIAGNOSIS — L578 Other skin changes due to chronic exposure to nonionizing radiation: Secondary | ICD-10-CM | POA: Diagnosis not present

## 2019-06-30 DIAGNOSIS — D485 Neoplasm of uncertain behavior of skin: Secondary | ICD-10-CM | POA: Diagnosis not present

## 2019-06-30 DIAGNOSIS — L814 Other melanin hyperpigmentation: Secondary | ICD-10-CM | POA: Diagnosis not present

## 2019-06-30 DIAGNOSIS — D2239 Melanocytic nevi of other parts of face: Secondary | ICD-10-CM | POA: Diagnosis not present

## 2019-06-30 DIAGNOSIS — D225 Melanocytic nevi of trunk: Secondary | ICD-10-CM | POA: Diagnosis not present

## 2019-09-11 DIAGNOSIS — D485 Neoplasm of uncertain behavior of skin: Secondary | ICD-10-CM | POA: Diagnosis not present

## 2019-12-10 DIAGNOSIS — Z23 Encounter for immunization: Secondary | ICD-10-CM | POA: Diagnosis not present

## 2019-12-31 DIAGNOSIS — D2239 Melanocytic nevi of other parts of face: Secondary | ICD-10-CM | POA: Diagnosis not present

## 2019-12-31 DIAGNOSIS — D225 Melanocytic nevi of trunk: Secondary | ICD-10-CM | POA: Diagnosis not present

## 2019-12-31 DIAGNOSIS — L814 Other melanin hyperpigmentation: Secondary | ICD-10-CM | POA: Diagnosis not present

## 2020-04-22 DIAGNOSIS — S39012A Strain of muscle, fascia and tendon of lower back, initial encounter: Secondary | ICD-10-CM | POA: Diagnosis not present

## 2020-04-22 DIAGNOSIS — E669 Obesity, unspecified: Secondary | ICD-10-CM | POA: Diagnosis not present

## 2020-04-22 DIAGNOSIS — M62830 Muscle spasm of back: Secondary | ICD-10-CM | POA: Diagnosis not present

## 2020-04-22 DIAGNOSIS — Z6834 Body mass index (BMI) 34.0-34.9, adult: Secondary | ICD-10-CM | POA: Diagnosis not present

## 2020-05-06 DIAGNOSIS — Z01419 Encounter for gynecological examination (general) (routine) without abnormal findings: Secondary | ICD-10-CM | POA: Diagnosis not present

## 2020-05-06 DIAGNOSIS — Z3046 Encounter for surveillance of implantable subdermal contraceptive: Secondary | ICD-10-CM | POA: Diagnosis not present

## 2020-05-20 DIAGNOSIS — Z131 Encounter for screening for diabetes mellitus: Secondary | ICD-10-CM | POA: Diagnosis not present

## 2020-05-20 DIAGNOSIS — E669 Obesity, unspecified: Secondary | ICD-10-CM | POA: Diagnosis not present

## 2020-05-20 DIAGNOSIS — Z0000 Encounter for general adult medical examination without abnormal findings: Secondary | ICD-10-CM | POA: Diagnosis not present

## 2020-05-20 DIAGNOSIS — Z13220 Encounter for screening for lipoid disorders: Secondary | ICD-10-CM | POA: Diagnosis not present

## 2020-05-20 DIAGNOSIS — E538 Deficiency of other specified B group vitamins: Secondary | ICD-10-CM | POA: Diagnosis not present

## 2020-05-21 DIAGNOSIS — E538 Deficiency of other specified B group vitamins: Secondary | ICD-10-CM | POA: Diagnosis not present

## 2020-07-07 DIAGNOSIS — F419 Anxiety disorder, unspecified: Secondary | ICD-10-CM | POA: Diagnosis not present

## 2020-07-07 DIAGNOSIS — Z6834 Body mass index (BMI) 34.0-34.9, adult: Secondary | ICD-10-CM | POA: Diagnosis not present

## 2020-08-09 DIAGNOSIS — R419 Unspecified symptoms and signs involving cognitive functions and awareness: Secondary | ICD-10-CM | POA: Diagnosis not present

## 2020-09-08 DIAGNOSIS — D225 Melanocytic nevi of trunk: Secondary | ICD-10-CM | POA: Diagnosis not present

## 2020-09-08 DIAGNOSIS — D485 Neoplasm of uncertain behavior of skin: Secondary | ICD-10-CM | POA: Diagnosis not present

## 2020-09-08 DIAGNOSIS — D2239 Melanocytic nevi of other parts of face: Secondary | ICD-10-CM | POA: Diagnosis not present

## 2020-09-08 DIAGNOSIS — L814 Other melanin hyperpigmentation: Secondary | ICD-10-CM | POA: Diagnosis not present

## 2020-11-17 DIAGNOSIS — M7989 Other specified soft tissue disorders: Secondary | ICD-10-CM | POA: Diagnosis not present

## 2020-11-17 DIAGNOSIS — M79661 Pain in right lower leg: Secondary | ICD-10-CM | POA: Diagnosis not present

## 2020-11-17 DIAGNOSIS — E669 Obesity, unspecified: Secondary | ICD-10-CM | POA: Diagnosis not present

## 2020-11-17 DIAGNOSIS — M79662 Pain in left lower leg: Secondary | ICD-10-CM | POA: Diagnosis not present

## 2020-11-22 DIAGNOSIS — R600 Localized edema: Secondary | ICD-10-CM | POA: Diagnosis not present

## 2020-11-22 DIAGNOSIS — M7989 Other specified soft tissue disorders: Secondary | ICD-10-CM | POA: Diagnosis not present

## 2020-11-22 DIAGNOSIS — M79604 Pain in right leg: Secondary | ICD-10-CM | POA: Diagnosis not present

## 2020-11-22 DIAGNOSIS — M79661 Pain in right lower leg: Secondary | ICD-10-CM | POA: Diagnosis not present

## 2020-11-22 DIAGNOSIS — M79605 Pain in left leg: Secondary | ICD-10-CM | POA: Diagnosis not present

## 2020-12-03 DIAGNOSIS — Z23 Encounter for immunization: Secondary | ICD-10-CM | POA: Diagnosis not present

## 2021-03-09 DIAGNOSIS — G2581 Restless legs syndrome: Secondary | ICD-10-CM | POA: Diagnosis not present

## 2021-03-09 DIAGNOSIS — M7989 Other specified soft tissue disorders: Secondary | ICD-10-CM | POA: Diagnosis not present

## 2021-03-09 DIAGNOSIS — M79662 Pain in left lower leg: Secondary | ICD-10-CM | POA: Diagnosis not present

## 2021-03-09 DIAGNOSIS — M79661 Pain in right lower leg: Secondary | ICD-10-CM | POA: Diagnosis not present

## 2021-03-09 DIAGNOSIS — R0600 Dyspnea, unspecified: Secondary | ICD-10-CM | POA: Diagnosis not present

## 2021-03-14 DIAGNOSIS — R0600 Dyspnea, unspecified: Secondary | ICD-10-CM | POA: Diagnosis not present

## 2021-05-24 DIAGNOSIS — E669 Obesity, unspecified: Secondary | ICD-10-CM | POA: Diagnosis not present

## 2021-05-24 DIAGNOSIS — Z6837 Body mass index (BMI) 37.0-37.9, adult: Secondary | ICD-10-CM | POA: Diagnosis not present

## 2021-05-24 DIAGNOSIS — Z131 Encounter for screening for diabetes mellitus: Secondary | ICD-10-CM | POA: Diagnosis not present

## 2021-05-24 DIAGNOSIS — Z0000 Encounter for general adult medical examination without abnormal findings: Secondary | ICD-10-CM | POA: Diagnosis not present

## 2021-08-16 DIAGNOSIS — J029 Acute pharyngitis, unspecified: Secondary | ICD-10-CM | POA: Diagnosis not present

## 2021-08-16 DIAGNOSIS — Z6835 Body mass index (BMI) 35.0-35.9, adult: Secondary | ICD-10-CM | POA: Diagnosis not present

## 2021-09-06 DIAGNOSIS — Z6835 Body mass index (BMI) 35.0-35.9, adult: Secondary | ICD-10-CM | POA: Diagnosis not present

## 2021-09-06 DIAGNOSIS — Z01419 Encounter for gynecological examination (general) (routine) without abnormal findings: Secondary | ICD-10-CM | POA: Diagnosis not present

## 2021-09-27 DIAGNOSIS — J309 Allergic rhinitis, unspecified: Secondary | ICD-10-CM | POA: Diagnosis not present

## 2021-10-03 DIAGNOSIS — S80211A Abrasion, right knee, initial encounter: Secondary | ICD-10-CM | POA: Diagnosis not present

## 2021-10-03 DIAGNOSIS — M7041 Prepatellar bursitis, right knee: Secondary | ICD-10-CM | POA: Diagnosis not present

## 2021-10-03 DIAGNOSIS — S8991XA Unspecified injury of right lower leg, initial encounter: Secondary | ICD-10-CM | POA: Diagnosis not present

## 2021-10-03 DIAGNOSIS — M7051 Other bursitis of knee, right knee: Secondary | ICD-10-CM | POA: Diagnosis not present

## 2021-10-05 DIAGNOSIS — M25561 Pain in right knee: Secondary | ICD-10-CM | POA: Diagnosis not present

## 2021-10-10 DIAGNOSIS — M25561 Pain in right knee: Secondary | ICD-10-CM | POA: Diagnosis not present

## 2021-10-12 DIAGNOSIS — M25561 Pain in right knee: Secondary | ICD-10-CM | POA: Diagnosis not present

## 2021-10-12 DIAGNOSIS — D485 Neoplasm of uncertain behavior of skin: Secondary | ICD-10-CM | POA: Diagnosis not present

## 2021-10-12 DIAGNOSIS — L814 Other melanin hyperpigmentation: Secondary | ICD-10-CM | POA: Diagnosis not present

## 2021-10-12 DIAGNOSIS — D225 Melanocytic nevi of trunk: Secondary | ICD-10-CM | POA: Diagnosis not present

## 2021-10-12 DIAGNOSIS — D2239 Melanocytic nevi of other parts of face: Secondary | ICD-10-CM | POA: Diagnosis not present

## 2022-02-10 DIAGNOSIS — R1031 Right lower quadrant pain: Secondary | ICD-10-CM | POA: Diagnosis not present

## 2022-02-10 DIAGNOSIS — N201 Calculus of ureter: Secondary | ICD-10-CM | POA: Diagnosis not present

## 2022-02-10 DIAGNOSIS — R110 Nausea: Secondary | ICD-10-CM | POA: Diagnosis not present

## 2022-02-24 DIAGNOSIS — R61 Generalized hyperhidrosis: Secondary | ICD-10-CM | POA: Diagnosis not present

## 2022-02-24 DIAGNOSIS — K8020 Calculus of gallbladder without cholecystitis without obstruction: Secondary | ICD-10-CM | POA: Diagnosis not present

## 2022-02-24 DIAGNOSIS — R1011 Right upper quadrant pain: Secondary | ICD-10-CM | POA: Diagnosis not present

## 2022-02-24 DIAGNOSIS — K828 Other specified diseases of gallbladder: Secondary | ICD-10-CM | POA: Diagnosis not present

## 2022-02-25 DIAGNOSIS — K8010 Calculus of gallbladder with chronic cholecystitis without obstruction: Secondary | ICD-10-CM | POA: Diagnosis not present

## 2022-02-25 DIAGNOSIS — K8020 Calculus of gallbladder without cholecystitis without obstruction: Secondary | ICD-10-CM | POA: Diagnosis not present

## 2022-02-25 DIAGNOSIS — K828 Other specified diseases of gallbladder: Secondary | ICD-10-CM | POA: Diagnosis not present

## 2022-02-25 DIAGNOSIS — R109 Unspecified abdominal pain: Secondary | ICD-10-CM | POA: Diagnosis not present

## 2022-02-25 DIAGNOSIS — K810 Acute cholecystitis: Secondary | ICD-10-CM | POA: Diagnosis not present

## 2022-02-25 DIAGNOSIS — R110 Nausea: Secondary | ICD-10-CM | POA: Diagnosis not present

## 2022-02-25 DIAGNOSIS — Z79899 Other long term (current) drug therapy: Secondary | ICD-10-CM | POA: Diagnosis not present

## 2022-02-26 DIAGNOSIS — Z79899 Other long term (current) drug therapy: Secondary | ICD-10-CM | POA: Diagnosis not present

## 2022-02-26 DIAGNOSIS — K810 Acute cholecystitis: Secondary | ICD-10-CM | POA: Diagnosis not present

## 2022-02-26 DIAGNOSIS — K8010 Calculus of gallbladder with chronic cholecystitis without obstruction: Secondary | ICD-10-CM | POA: Diagnosis not present

## 2022-03-04 DIAGNOSIS — R5381 Other malaise: Secondary | ICD-10-CM | POA: Diagnosis not present

## 2022-03-04 DIAGNOSIS — E860 Dehydration: Secondary | ICD-10-CM | POA: Diagnosis not present

## 2022-03-04 DIAGNOSIS — Z20822 Contact with and (suspected) exposure to covid-19: Secondary | ICD-10-CM | POA: Diagnosis not present

## 2022-03-04 DIAGNOSIS — R000 Tachycardia, unspecified: Secondary | ICD-10-CM | POA: Diagnosis not present

## 2022-03-04 DIAGNOSIS — R110 Nausea: Secondary | ICD-10-CM | POA: Diagnosis not present

## 2022-03-04 DIAGNOSIS — R251 Tremor, unspecified: Secondary | ICD-10-CM | POA: Diagnosis not present

## 2022-04-18 DIAGNOSIS — M545 Low back pain, unspecified: Secondary | ICD-10-CM | POA: Diagnosis not present
